# Patient Record
Sex: Male | Born: 1985 | Race: Black or African American | Hispanic: No | Marital: Single | Smoking: Never smoker
Health system: Southern US, Community
[De-identification: ages and names within clinical notes are randomized; demographics above are authoritative.]

## PROBLEM LIST (undated history)

## (undated) DIAGNOSIS — E7800 Pure hypercholesterolemia, unspecified: Secondary | ICD-10-CM

---

## 2017-04-07 ENCOUNTER — Emergency Department (HOSPITAL_COMMUNITY)
Admission: EM | Admit: 2017-04-07 | Discharge: 2017-04-07 | Disposition: A | Discharge status: Home / Self Care | Payer: Self-pay | Attending: Emergency Medicine | Admitting: Emergency Medicine

## 2017-04-07 ENCOUNTER — Emergency Department (HOSPITAL_COMMUNITY): Payer: Self-pay

## 2017-04-07 ENCOUNTER — Encounter (HOSPITAL_COMMUNITY): Payer: Self-pay | Admitting: Emergency Medicine

## 2017-04-07 DIAGNOSIS — S29011A Strain of muscle and tendon of front wall of thorax, initial encounter: Secondary | ICD-10-CM | POA: Insufficient documentation

## 2017-04-07 DIAGNOSIS — Y9389 Activity, other specified: Secondary | ICD-10-CM | POA: Insufficient documentation

## 2017-04-07 DIAGNOSIS — X500XXA Overexertion from strenuous movement or load, initial encounter: Secondary | ICD-10-CM | POA: Insufficient documentation

## 2017-04-07 DIAGNOSIS — Y929 Unspecified place or not applicable: Secondary | ICD-10-CM | POA: Insufficient documentation

## 2017-04-07 DIAGNOSIS — Y990 Civilian activity done for income or pay: Secondary | ICD-10-CM | POA: Insufficient documentation

## 2017-04-07 HISTORY — DX: Pure hypercholesterolemia, unspecified: E78.00

## 2017-04-07 LAB — BASIC METABOLIC PANEL
Anion gap: 8 (ref 5–15)
BUN: 18 mg/dL (ref 6–20)
CALCIUM: 9.2 mg/dL (ref 8.9–10.3)
CHLORIDE: 102 mmol/L (ref 101–111)
CO2: 28 mmol/L (ref 22–32)
Creatinine, Ser: 1.11 mg/dL (ref 0.61–1.24)
Glucose, Bld: 90 mg/dL (ref 65–99)
Potassium: 3.7 mmol/L (ref 3.5–5.1)
SODIUM: 138 mmol/L (ref 135–145)

## 2017-04-07 LAB — CBC
HCT: 43.8 % (ref 39.0–52.0)
Hemoglobin: 15 g/dL (ref 13.0–17.0)
MCH: 29.5 pg (ref 26.0–34.0)
MCHC: 34.2 g/dL (ref 30.0–36.0)
MCV: 86.2 fL (ref 78.0–100.0)
Platelets: 232 10*3/uL (ref 150–400)
RBC: 5.08 MIL/uL (ref 4.22–5.81)
RDW: 12.1 % (ref 11.5–15.5)
WBC: 10.5 10*3/uL (ref 4.0–10.5)

## 2017-04-07 LAB — I-STAT TROPONIN, ED: TROPONIN I, POC: 0 ng/mL (ref 0.00–0.08)

## 2017-04-07 MED ORDER — METHOCARBAMOL 500 MG PO TABS: 500.0000 mg | ORAL_TABLET | Freq: Two times a day (BID) | ORAL | 0 refills | Status: AC | PRN

## 2017-04-07 MED ORDER — IBUPROFEN 600 MG PO TABS: 600.0000 mg | ORAL_TABLET | Freq: Four times a day (QID) | ORAL | 0 refills | Status: AC | PRN

## 2017-04-07 NOTE — ED Triage Notes (Signed)
Pt reports chest pain x4 days primarily in Left side of chest, especially when adducting arm into body. Reports some shortness of breath. Reports hx high cholesterol and some GERD symptoms.

## 2017-04-07 NOTE — ED Provider Notes (Signed)
[facility]-EMERGENCY DEPT Provider Note   CSN: [id number] Arrival date & time: 04/07/17  2011    History   Chief Complaint Chief Complaint  Patient presents with  . Chest Pain    HPI Jordan Murphy is a 31 y.o. male.  31 year old male with a history of dyslipidemia presents to the emergency department for evaluation of 4 days of chest pain. He states that chest pain has been fairly constant with intermittent sharp pain sensations. Sharp pains are aggravated by pressure to his chest wall as well as when reaching his left arm over to his right shoulder. Denies taking any medications for his symptoms. He believes the pain has been steadily worsening. He denies any associated diaphoresis, nausea, vomiting, or extremity weakness. 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 He denies a personal or family history of ACS. No associated leg swelling, hemoptysis, recent surgeries, or recent hospitalizations. Patient denies tobacco history.   The history is provided by the patient. No language interpreter was used.  Chest Pain      Past Medical History:  Diagnosis Date  . Hypercholesteremia     There are no active problems to display for this patient.   History reviewed. No pertinent surgical history.     Home Medications    Prior to Admission medications   Medication Sig Start Date End Date Taking? Authorizing Provider  ibuprofen (ADVIL,MOTRIN) 600 MG tablet Take 1 tablet (600 mg total) by mouth every 6 (six) hours as needed. 04/07/17   Antony Madura, PA-C  methocarbamol (ROBAXIN) 500 MG tablet Take 1 tablet (500 mg total) by mouth 2 (two) times daily as needed for muscle spasms. 04/07/17   Antony Madura, PA-C    Family History No family history on file.  Social History Social History  Substance Use Topics  . Smoking status: Never Smoker  . Smokeless tobacco: Never Used  . Alcohol use Yes     Allergies   Patient has no known allergies.   Review of Systems Review of  Systems  Cardiovascular: Positive for chest pain.   Ten systems reviewed and are negative for acute change, except as noted in the HPI.    Physical Exam Updated Vital Signs BP (!) 122/94   Pulse 66   Temp 98.5 F (36.9 C) (Oral)   Resp 19   Ht  (1.753 m)   Wt 95.3 kg   SpO2 98%   BMI 31.01 kg/m   Physical Exam  Constitutional: He is oriented to person, place, and time. He appears well-developed and well-nourished. No distress.  Nontoxic and in NAD  HENT:  Head: Normocephalic and atraumatic.  Eyes: Conjunctivae and EOM are normal. No scleral icterus.  Neck: Normal range of motion.  No JVD  Cardiovascular: Normal rate, regular rhythm and intact distal pulses.   Pulmonary/Chest: Effort normal. No respiratory distress. He has no wheezes. He has no rales. He exhibits tenderness. He exhibits no crepitus, no deformity and no swelling.    Respirations even and unlabored. Lungs CTAB.  Musculoskeletal: Normal range of motion.  No BLE edema.  Neurological: He is alert and oriented to person, place, and time. He exhibits normal muscle tone. Coordination normal.  GCS 15. Patient moving all extremities.  Skin: Skin is warm and dry. No rash noted. He is not diaphoretic. No erythema. No pallor.  Psychiatric: He has a normal mood and affect. His behavior is normal.  Nursing note and vitals reviewed.    ED Treatments / Results  Labs (all labs ordered are listed, but only abnormal results are displayed) Labs Reviewed  BASIC METABOLIC PANEL  CBC  I-STAT TROPOININ, ED    EKG  EKG Interpretation  Date/Time:  Tuesday April 07 2017 20:17:54 EDT Ventricular Rate:  53 PR Interval:  152 QRS Duration: 94 QT Interval:  386 QTC Calculation: 362 R Axis:   74 Text Interpretation:  Sinus bradycardia Otherwise normal ECG No old tracing to compare Confirmed by Ethelda Chick  MD, SAM [id number]) on 04/07/2017 11:07:14 PM       Radiology Dg Chest 2 View  Result Date: 04/07/2017 CLINICAL  DATA:  Left-sided chest pain 4 days. EXAM: CHEST  2 VIEW COMPARISON:  None. FINDINGS: Lungs are adequately inflated without consolidation or effusion. Cardiomediastinal silhouette is within normal. Bones and soft tissues are normal. IMPRESSION: No active cardiopulmonary disease. Electronically Signed   By: Elberta Fortis M.D.   On: 04/07/2017 21:23    Procedures Procedures (including critical care time)  Medications Ordered in ED Medications - No data to display   Initial Impression / Assessment and Plan / ED Course  I have reviewed the triage vital signs and the nursing notes.  Pertinent labs & imaging results that were available during my care of the patient were reviewed by me and considered in my medical decision making (see chart for details).     31 year old male present to the emergency department for evaluation of chest pain. Pain is reproducible on palpation lending itself to musculoskeletal etiology. This is also suspected to be likely given frequent heavy lifting at the patient's place of employment. Cardiac workup today is reassuring. Heart score consistent with low risk of ACS. Also doubt PE; patient is PERC negative. Plan to continue with supportive management with NSAIDs and icing. Primary care follow-up advised and return precautions given. Patient discharged in stable condition with no unaddressed concerns.   Final Clinical Impressions(s) / ED Diagnoses   Final diagnoses:  Chest wall muscle strain, initial encounter    New Prescriptions Discharge Medication List as of 04/07/2017 11:03 PM    START taking these medications   Details  ibuprofen (ADVIL,MOTRIN) 600 MG tablet Take 1 tablet (600 mg total) by mouth every 6 (six) hours as needed., Starting Tue 04/07/2017, Print    methocarbamol (ROBAXIN) 500 MG tablet Take 1 tablet (500 mg total) by mouth 2 (two) times daily as needed for muscle spasms., Starting Tue 04/07/2017, Print         [place], PA-C 04/07/17  2130    Doug Sou, MD 04/08/17 [phone]

## 2018-03-05 ENCOUNTER — Ambulatory Visit: Payer: Self-pay | Admitting: Family Medicine

## 2018-05-07 IMAGING — DX DG CHEST 2V
2 series · 2 of 2 positions shown · non-contrast
Comparison: None.

CLINICAL DATA: Left-sided chest pain 4 days.

EXAM:
CHEST  2 VIEW

[chest pa]
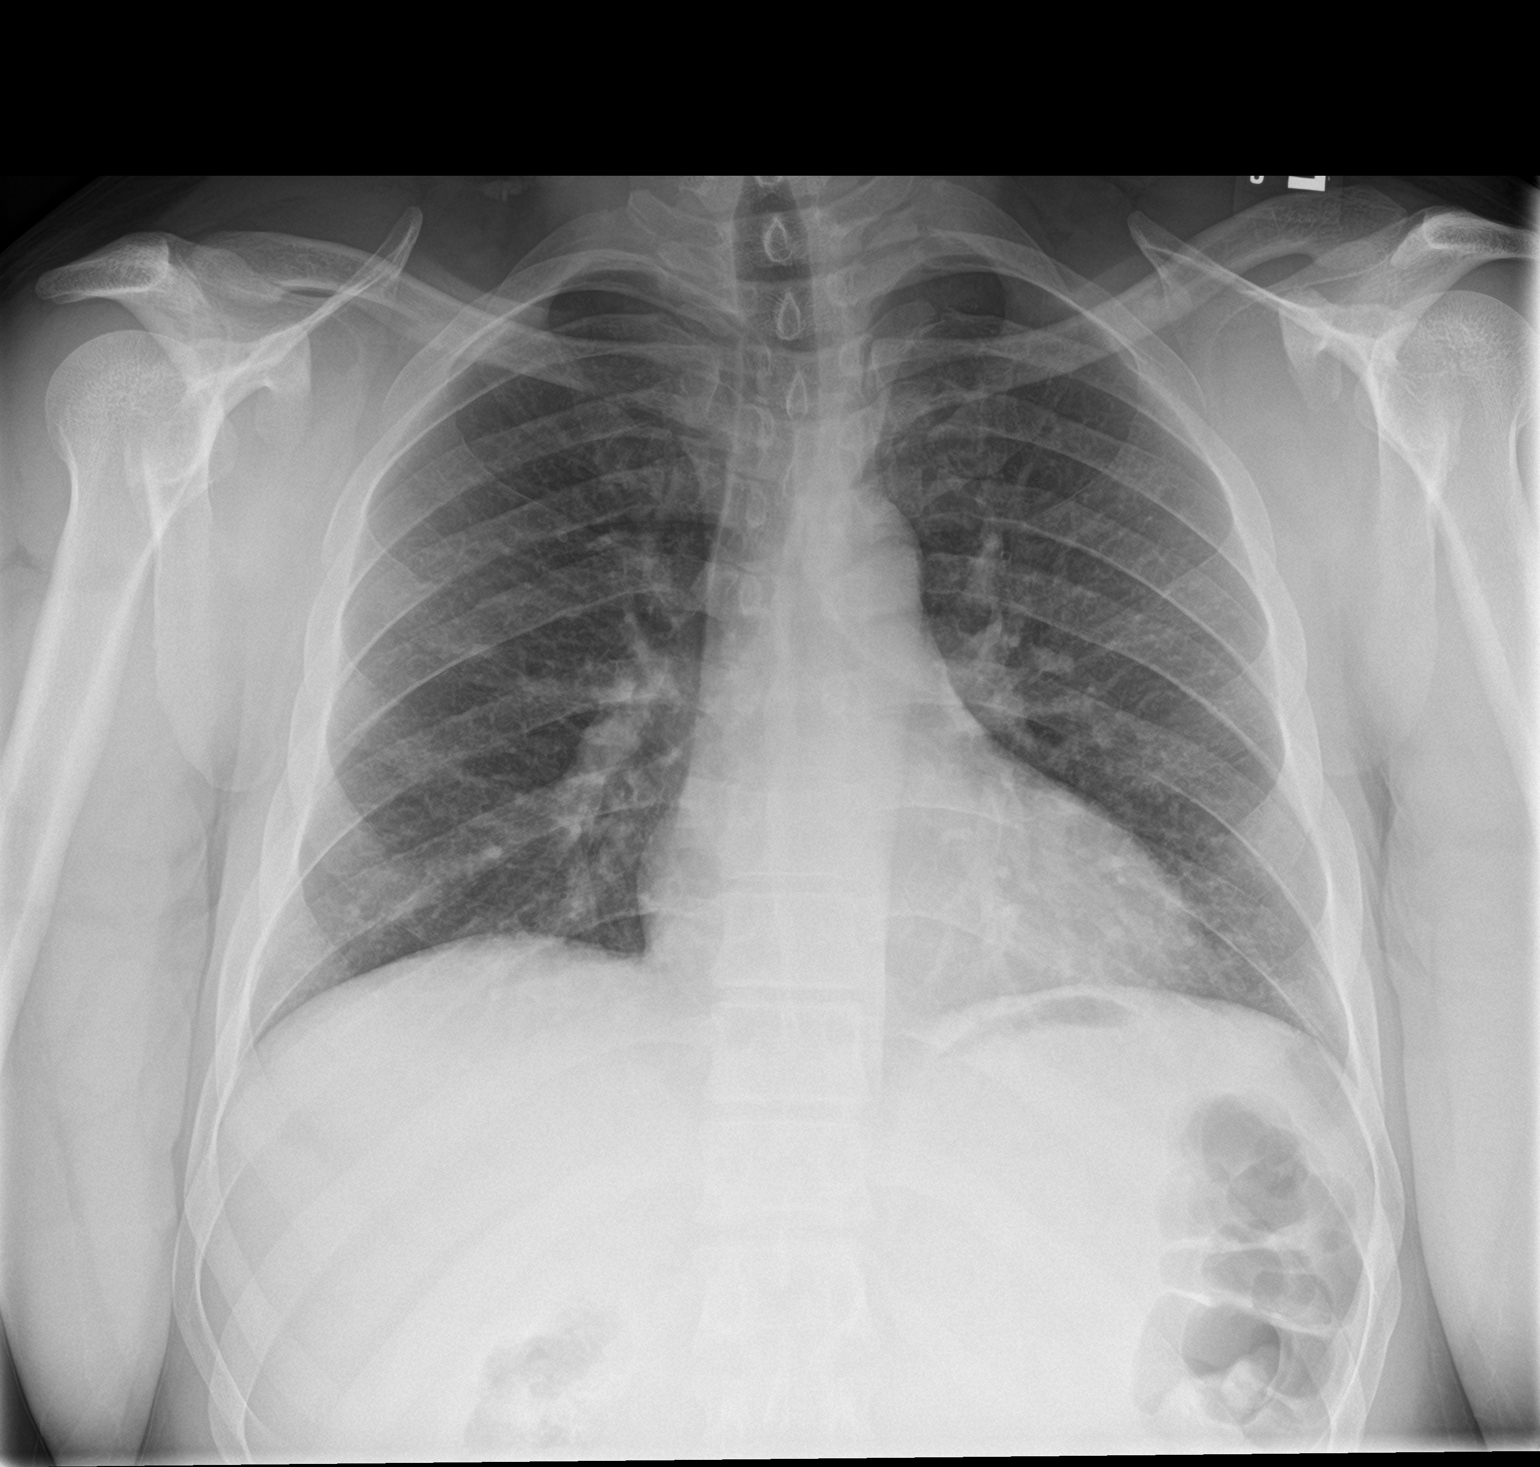

[chest lat]
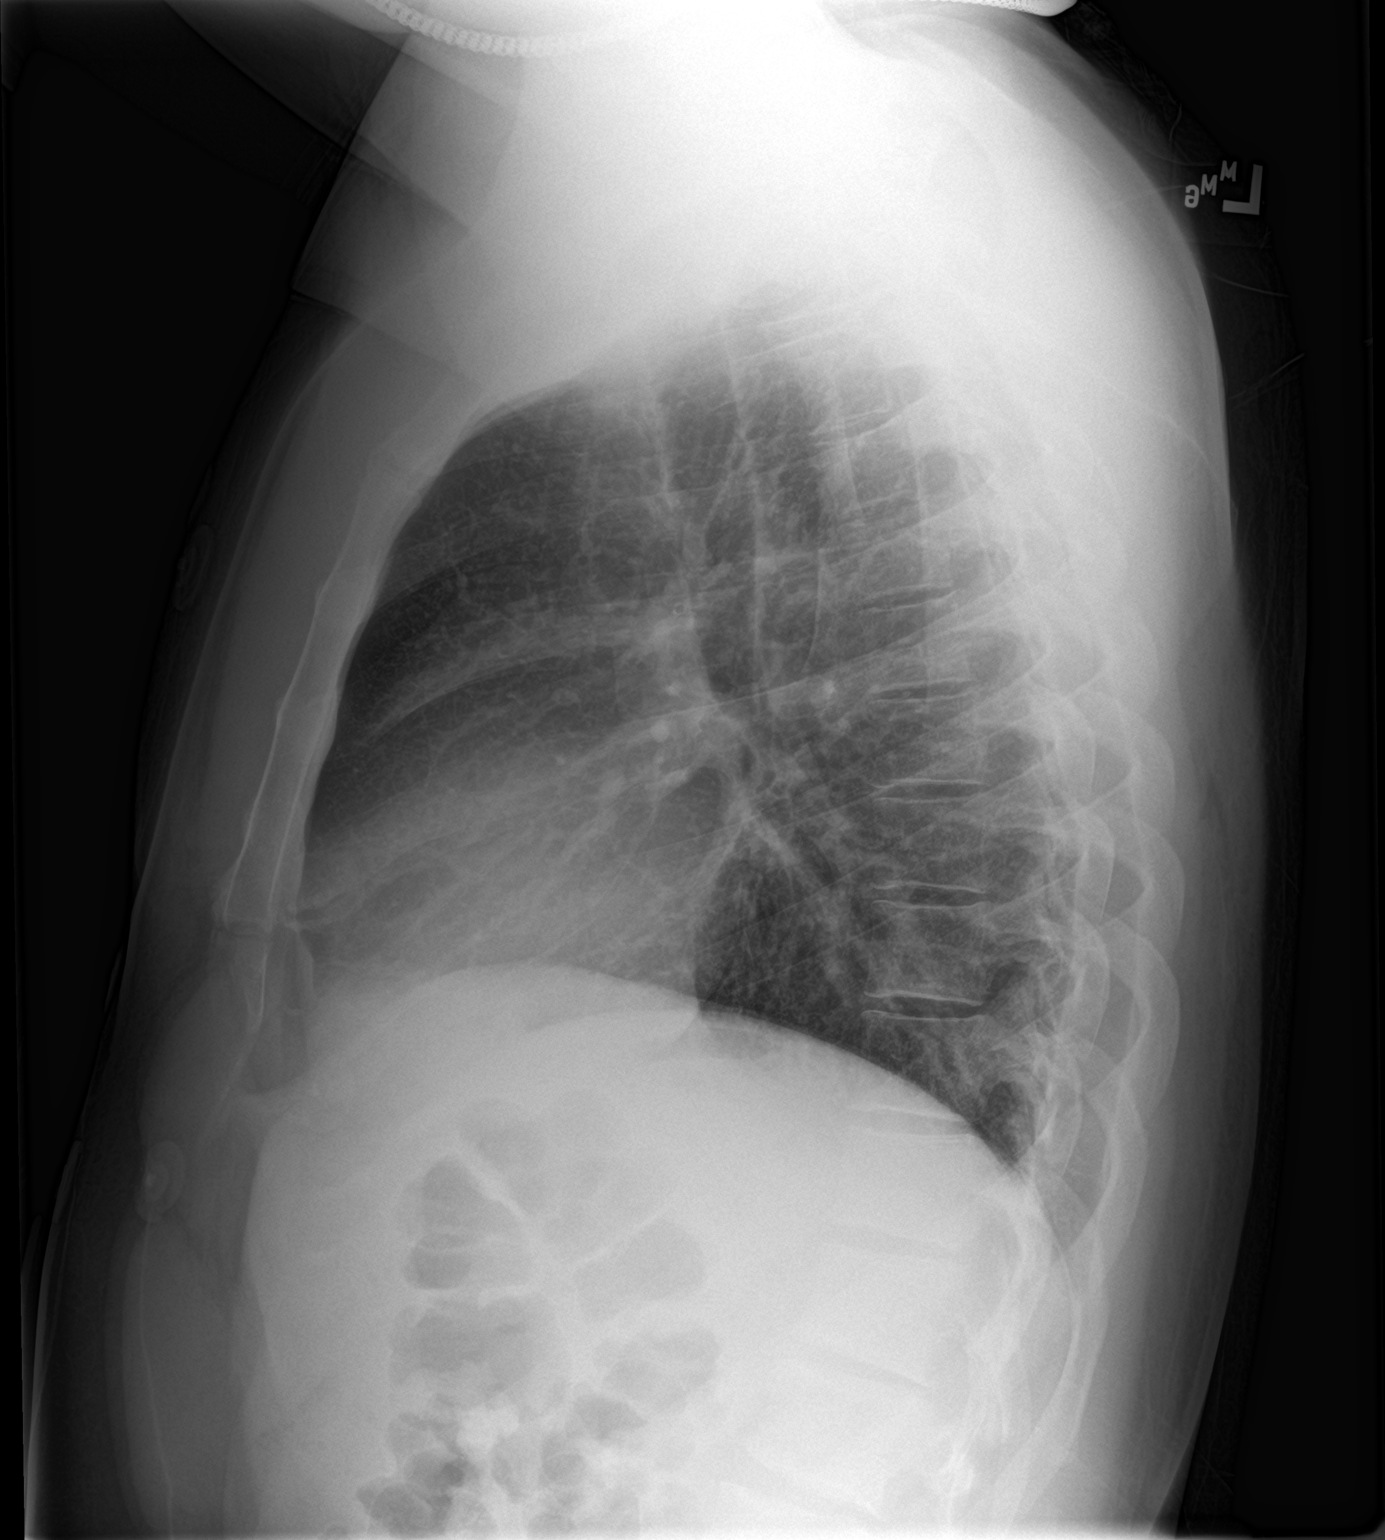

[2 of 2 positions shown; findings below may reference images not displayed]

FINDINGS: Lungs are adequately inflated without consolidation or effusion.
Cardiomediastinal silhouette is within normal. Bones and soft
tissues are normal.
IMPRESSION: No active cardiopulmonary disease.

## 2018-12-11 ENCOUNTER — Emergency Department (HOSPITAL_COMMUNITY)
Admission: EM | Admit: 2018-12-11 | Discharge: 2018-12-11 | Disposition: A | Discharge status: Home / Self Care | Payer: 59 | Attending: Emergency Medicine | Admitting: Emergency Medicine

## 2018-12-11 ENCOUNTER — Encounter (HOSPITAL_COMMUNITY): Payer: Self-pay

## 2018-12-11 DIAGNOSIS — R220 Localized swelling, mass and lump, head: Secondary | ICD-10-CM | POA: Insufficient documentation

## 2018-12-11 LAB — CBG MONITORING, ED: Glucose-Capillary: 120 mg/dL — ABNORMAL HIGH (ref 70–99)

## 2018-12-11 MED ORDER — PREDNISONE 10 MG PO TABS: 20.0000 mg | ORAL_TABLET | Freq: Every day | ORAL | 0 refills | Status: AC

## 2018-12-11 NOTE — ED Triage Notes (Signed)
Patient presented to ed with c/o left facial swelling that started on thru. Patient denies any injury or any insect bite to the site. Pt c/o itching to the site  at time but no other sign of symptom at this time.

## 2018-12-11 NOTE — Discharge Instructions (Addendum)
Please take benadryl as discussed and prednisone as prescribed.  Keep face clean and free of any possible allergens. Return if any problem swallowing, breathing, or increasing swelling.

## 2018-12-11 NOTE — ED Provider Notes (Signed)
[facility] Provider Note   CSN: [id number] Arrival date & time: 12/11/18  0815     History   Chief Complaint No chief complaint on file.   HPI Jordan Murphy is a 32 y.o. male.  HPI  32 year old male presents today complaining of swelling below the left eye.  He states this began on Thursday.  When he woke up Friday morning his eye was swollen close but this has decreased.  He has some swelling below the right eye.  Feels he may have been exposed to some allergens at work.  There is been some itching.  He used Benadryl with some relief.  He has not noted any redness of the eye itself.  He has not noted any nasal congestion, discharge, fever, pain in the face, or toothache.  He has not had any difficulty swallowing, speaking, or breathing.  His vision has been normal.  He has not had any similar symptoms in the past.  He denies taking any prescription medications.  No definite new allergens.  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  Past Medical History:  Diagnosis Date  . Hypercholesteremia     There are no active problems to display for this patient.   No past surgical history on file.      Home Medications    Prior to Admission medications   Medication Sig Start Date End Date Taking? Authorizing Provider  ibuprofen (ADVIL,MOTRIN) 600 MG tablet Take 1 tablet (600 mg total) by mouth every 6 (six) hours as needed. 04/07/17   Antony MaduraHumes, [place], PA-C  methocarbamol (ROBAXIN) 500 MG tablet Take 1 tablet (500 mg total) by mouth 2 (two) times daily as needed for muscle spasms. 04/07/17   Antony MaduraHumes, [place], PA-C    Family History No family history on file.  Social History Social History   Tobacco Use  . Smoking status: Never Smoker  . Smokeless tobacco: Never Used  Substance Use Topics  . Alcohol use: Yes  . Drug use: Not on file     Allergies   Patient  has no known allergies.   Review of Systems Review of Systems  Constitutional: Negative.   HENT: Negative.   Eyes: Positive for itching.  Respiratory: Negative.   Cardiovascular: Negative.   Gastrointestinal: Negative.   Endocrine:       Patient states that he gets tired after he eats sugar.  He is concerned that he is having blood sugar problems.  Genitourinary: Negative.   Musculoskeletal: Negative.   Allergic/Immunologic: Positive for environmental allergies.  Hematological: Negative.   Psychiatric/Behavioral: Negative.   All other systems reviewed and are negative.    Physical Exam Updated Vital Signs BP 133/81 (BP Location: Right Arm)   Pulse 64   Temp 97.8 F (36.6 C) (Oral)   Resp 20   Ht 1.753 m (5\' 9" )   Wt 97.5 kg   SpO2 100%   BMI 31.75 kg/m   Physical Exam Vitals signs and nursing note reviewed.  Constitutional:      General: He is not in acute distress.    Appearance: Normal appearance. He is not ill-appearing, toxic-appearing or diaphoretic.  HENT:     Head: Normocephalic and atraumatic.      Comments: Some mild swelling below and across the bridge of the nose.  No well delineated erythema, warmth, or tenderness to palpation    Right Ear:  Tympanic membrane and external ear normal.     Left Ear: Tympanic membrane and external ear normal.     Nose: Nose normal.     Mouth/Throat:     Lips: Pink.     Mouth: Mucous membranes are moist.     Pharynx: Oropharynx is clear. Uvula midline. No pharyngeal swelling, oropharyngeal exudate or uvula swelling.  Neck:     Musculoskeletal: Normal range of motion and neck supple. No muscular tenderness.  Cardiovascular:     Rate and Rhythm: Normal rate.     Pulses: Normal pulses.  Pulmonary:     Effort: Pulmonary effort is normal.  Musculoskeletal: Normal range of motion.  Skin:    General: Skin is warm.     Capillary Refill: Capillary refill takes less than 2 seconds.  Neurological:     General: No focal  deficit present.     Mental Status: He is alert.  Psychiatric:        Mood and Affect: Mood normal.      ED Treatments / Results  Labs (all labs ordered are listed, but only abnormal results are displayed) Labs Reviewed - No data to display  EKG None  Radiology No results found.  Procedures Procedures (including critical care time)  Medications Ordered in ED Medications - No data to display   Initial Impression / Assessment and Plan / ED Course  I have reviewed the triage vital signs and the nursing notes.  Pertinent labs & imaging results that were available during my care of the patient were reviewed by me and considered in my medical decision making (see chart for details).     Facial swelling, DDX Infection doubt infection patient without well delineated erythema, fever, teeth appear normal without any evidence of infection, there is no tenderness over the sinuses and no nasal discharge Medication reaction patient does not need any medications which are likely causes has not been taking anything new Allergic reaction contact dermatitis is highest on the list.  He is advised regarding keeping the area clean and dry.  He is to continue Benadryl.  He was given prescription for prednisone.  We have discussed return precautions especially fever, spreading redness, difficulty speaking, swallowing, or breathing and he voices understanding. Due to patient's concerns re bs- will check cbg although patient did eat [place] on the way here.  Final Clinical Impressions(s) / ED Diagnoses   Final diagnoses:  Facial swelling    ED Discharge Orders    None       Margarita Grizzleay, Jo-Ann Johanning, MD 12/11/18 [phone]

## 2019-11-06 ENCOUNTER — Telehealth: Payer: Medicaid Other | Admitting: Physician Assistant

## 2019-11-06 ENCOUNTER — Inpatient Hospital Stay
Admission: RE | Admit: 2019-11-06 | Discharge: 2019-11-06 | Disposition: A | Discharge status: Home / Self Care | Payer: 59 | Source: Ambulatory Visit

## 2019-11-06 DIAGNOSIS — H9209 Otalgia, unspecified ear: Secondary | ICD-10-CM

## 2019-11-06 NOTE — Progress Notes (Signed)
Based on what you shared with me, I feel your condition warrants further evaluation and I recommend that you be seen for a face to face visit.  Please contact your primary care physician practice to be seen. Many offices offer virtual options to be seen via video if you are not comfortable going in person to a medical facility at this time. You need examination of the ear to determine cause of pain -- this can be from cerumen impaction, a foreign body in the ear, eustachian tube dysfunction, middle ear infection/fluid or external ear infection -- all of which are treated in a different manner.   If you do not have a PCP, [facility] offers a free physician referral service available at [phone]. Our trained staff has the experience, knowledge and resources to put you in touch with a physician who is right for you.   You also have the option of a video visit through [email or web address]  If you are having a true medical emergency please call 911.  NOTE: If you entered your credit card information for this eVisit, you will not be charged. You may see a "hold" on your card for the $35 but that hold will drop off and you will not have a charge processed.  Your e-visit answers were reviewed by a board certified advanced clinical practitioner to complete your personal care plan.  Thank you for using e-Visits.

## 2019-11-07 ENCOUNTER — Inpatient Hospital Stay
Admission: RE | Admit: 2019-11-07 | Discharge: 2019-11-07 | Disposition: A | Discharge status: Home / Self Care | Payer: 59 | Source: Ambulatory Visit

## 2023-03-02 ENCOUNTER — Ambulatory Visit: Payer: 59 | Admitting: Student

## 2023-12-08 ENCOUNTER — Telehealth (INDEPENDENT_AMBULATORY_CARE_PROVIDER_SITE_OTHER): Payer: Self-pay | Admitting: Primary Care

## 2023-12-08 NOTE — Telephone Encounter (Signed)
Called pt to remind them about apt. Phone number is unavailable.

## 2023-12-09 ENCOUNTER — Ambulatory Visit (INDEPENDENT_AMBULATORY_CARE_PROVIDER_SITE_OTHER): Payer: 59 | Admitting: Primary Care
